# Patient Record
Sex: Female | Born: 1990 | Race: White | Hispanic: No | Marital: Single | Smoking: Never smoker
Health system: Southern US, Community
[De-identification: ages and names within clinical notes are randomized; demographics above are authoritative.]

## PROBLEM LIST (undated history)

## (undated) DIAGNOSIS — R253 Fasciculation: Secondary | ICD-10-CM

---

## 2018-02-11 ENCOUNTER — Emergency Department
Admission: EM | Admit: 2018-02-11 | Discharge: 2018-02-11 | Disposition: A | Discharge status: Home / Self Care | Payer: Self-pay | Attending: Emergency Medicine | Admitting: Emergency Medicine

## 2018-02-11 ENCOUNTER — Encounter: Payer: Self-pay | Admitting: Emergency Medicine

## 2018-02-11 ENCOUNTER — Other Ambulatory Visit: Payer: Self-pay

## 2018-02-11 ENCOUNTER — Emergency Department: Payer: Self-pay

## 2018-02-11 DIAGNOSIS — R109 Unspecified abdominal pain: Secondary | ICD-10-CM

## 2018-02-11 DIAGNOSIS — N390 Urinary tract infection, site not specified: Secondary | ICD-10-CM | POA: Insufficient documentation

## 2018-02-11 DIAGNOSIS — R112 Nausea with vomiting, unspecified: Secondary | ICD-10-CM | POA: Insufficient documentation

## 2018-02-11 HISTORY — DX: Fasciculation: R25.3

## 2018-02-11 LAB — URINALYSIS, COMPLETE (UACMP) WITH MICROSCOPIC
Bilirubin Urine: NEGATIVE
GLUCOSE, UA: NEGATIVE mg/dL
Ketones, ur: 20 mg/dL — AB
NITRITE: NEGATIVE
PH: 6 (ref 5.0–8.0)
PROTEIN: 30 mg/dL — AB
SPECIFIC GRAVITY, URINE: 1.031 — AB (ref 1.005–1.030)

## 2018-02-11 LAB — COMPREHENSIVE METABOLIC PANEL
ALT: 20 U/L (ref 0–44)
AST: 22 U/L (ref 15–41)
Albumin: 4.5 g/dL (ref 3.5–5.0)
Alkaline Phosphatase: 64 U/L (ref 38–126)
Anion gap: 11 (ref 5–15)
BILIRUBIN TOTAL: 0.9 mg/dL (ref 0.3–1.2)
BUN: 15 mg/dL (ref 6–20)
CHLORIDE: 107 mmol/L (ref 98–111)
CO2: 20 mmol/L — ABNORMAL LOW (ref 22–32)
Calcium: 9.5 mg/dL (ref 8.9–10.3)
Creatinine, Ser: 0.95 mg/dL (ref 0.44–1.00)
Glucose, Bld: 111 mg/dL — ABNORMAL HIGH (ref 70–99)
Potassium: 3.7 mmol/L (ref 3.5–5.1)
Sodium: 138 mmol/L (ref 135–145)
TOTAL PROTEIN: 8.2 g/dL — AB (ref 6.5–8.1)

## 2018-02-11 LAB — CBC
HCT: 44.7 % (ref 35.0–47.0)
HEMOGLOBIN: 15.6 g/dL (ref 12.0–16.0)
MCH: 30 pg (ref 26.0–34.0)
MCHC: 34.9 g/dL (ref 32.0–36.0)
MCV: 86 fL (ref 80.0–100.0)
PLATELETS: 454 10*3/uL — AB (ref 150–440)
RBC: 5.2 MIL/uL (ref 3.80–5.20)
RDW: 13.2 % (ref 11.5–14.5)
WBC: 11.6 10*3/uL — AB (ref 3.6–11.0)

## 2018-02-11 LAB — LIPASE, BLOOD: LIPASE: 24 U/L (ref 11–51)

## 2018-02-11 LAB — PREGNANCY, URINE: Preg Test, Ur: NEGATIVE

## 2018-02-11 MED ORDER — CEPHALEXIN 500 MG PO CAPS
500.0000 mg | ORAL_CAPSULE | Freq: Once | ORAL | Status: AC
  Administered 2018-02-11: 500 mg via ORAL
  Filled 2018-02-11: qty 1

## 2018-02-11 MED ORDER — IOPAMIDOL (ISOVUE-300) INJECTION 61%
125.0000 mL | Freq: Once | INTRAVENOUS | Status: AC | PRN
  Administered 2018-02-11: 125 mL via INTRAVENOUS

## 2018-02-11 MED ORDER — ONDANSETRON HCL 4 MG PO TABS: 4.0000 mg | ORAL_TABLET | Freq: Every day | ORAL | 0 refills | Status: AC | PRN

## 2018-02-11 MED ORDER — CEPHALEXIN 500 MG PO CAPS: 500.0000 mg | ORAL_CAPSULE | Freq: Three times a day (TID) | ORAL | 0 refills | Status: AC

## 2018-02-11 MED ORDER — SODIUM CHLORIDE 0.9 % IV BOLUS
1000.0000 mL | Freq: Once | INTRAVENOUS | Status: AC
  Administered 2018-02-11: 1000 mL via INTRAVENOUS

## 2018-02-11 MED ORDER — ONDANSETRON HCL 4 MG/2ML IJ SOLN
4.0000 mg | Freq: Once | INTRAMUSCULAR | Status: AC
  Administered 2018-02-11: 4 mg via INTRAVENOUS
  Filled 2018-02-11: qty 2

## 2018-02-11 NOTE — ED Provider Notes (Signed)
[facility] Emergency Department Provider Note ____________________________________________   First MD Initiated Contact with Patient 02/11/18 1421     (approximate)  I have reviewed the triage vital signs and the nursing notes.   HISTORY  Chief Complaint Abdominal Pain   HPI Laura Sullivan is a 27 y.o. female without any chronic medical conditions was presented to the emergency department today complaining of right-sided abdominal pain over the past 3 days.  She says that she is also vomited 3 times.  Says that the pain is a 5-6 out of 10 and is an aching pain that is nonradiating.  Denies any burning with urination.  Denies any vaginal discharge but says that she is on her period.  Denies any history of abdominal surgery.   Past Medical History:  Diagnosis Date  . Twitching     There are no active problems to display for this patient.   History reviewed. No pertinent surgical history.  Prior to Admission medications   Not on File    Allergies Patient has no known allergies.  History reviewed. No pertinent family history.  Social History Social History   Tobacco Use  . Smoking status: Never Smoker  . Smokeless tobacco: Never Used  Substance Use Topics  . Alcohol use: Never    Frequency: Never  . Drug use: Never    Review of Systems  Constitutional: No fever/chills Eyes: No visual changes. ENT: No sore throat. Cardiovascular: Denies chest pain. Respiratory: Denies shortness of breath. Gastrointestinal:     No constipation. Genitourinary: Negative for dysuria. Musculoskeletal: Negative for back pain. Skin: Negative for rash. Neurological: Negative for headaches, focal weakness or numbness.   ____________________________________________   PHYSICAL EXAM:  VITAL SIGNS: ED Triage Vitals [02/11/18 1055]  Enc Vitals Group     BP (!) 144/115     Pulse Rate (!) 115     Resp (!) 24     Temp 98.6 F (37 C)     Temp  Source Oral     SpO2 97 %     Weight 245 lb (111.1 kg)     Height 5\' 6"  (1.676 m)     Head Circumference      Peak Flow      Pain Score 5     Pain Loc      Pain Edu?      Excl. in GC?     Constitutional: Alert and oriented. Well appearing and in no acute distress. Eyes: Conjunctivae are normal.  Head: Atraumatic. Nose: No congestion/rhinnorhea. Mouth/Throat: Mucous membranes are moist.  Neck: No stridor.   Cardiovascular: Normal rate, regular rhythm. Grossly normal heart sounds.  Heart rate of 83 bpm in the room. Respiratory: Normal respiratory effort.  No retractions. Lungs CTAB. Gastrointestinal: Soft with moderate right upper as well as right periumbilical tenderness to palpation.  No right lower quadrant tenderness to palpation.  No left-sided abdominal tenderness to palpation.  No distention.  No rebound or guarding. Musculoskeletal: No lower extremity tenderness nor edema.  No joint effusions. Neurologic:  Normal speech and language. No gross focal neurologic deficits are appreciated. Skin:  Skin is warm, dry and intact. No rash noted. Psychiatric: Mood and affect are normal. Speech and behavior are normal.  ____________________________________________   LABS (all labs ordered are listed, but only abnormal results are displayed)  Labs Reviewed  COMPREHENSIVE METABOLIC PANEL - Abnormal; Notable for the following components:      Result Value   CO2 20 (*)  Glucose, Bld 111 (*)    Total Protein 8.2 (*)    All other components within normal limits  CBC - Abnormal; Notable for the following components:   WBC 11.6 (*)    Platelets 454 (*)    All other components within normal limits  URINALYSIS, COMPLETE (UACMP) WITH MICROSCOPIC - Abnormal; Notable for the following components:   Color, Urine AMBER (*)    APPearance CLOUDY (*)    Specific Gravity, Urine 1.031 (*)    Hgb urine dipstick LARGE (*)    Ketones, ur 20 (*)    Protein, ur 30 (*)    Leukocytes, UA LARGE (*)     WBC, UA >50 (*)    Bacteria, UA RARE (*)    All other components within normal limits  LIPASE, BLOOD  PREGNANCY, URINE  POC URINE PREG, ED   ____________________________________________  EKG  ED ECG REPORT I, Arelia Longest, the attending physician, personally viewed and interpreted this ECG.   Date: 02/11/2018  EKG Time: 1102  Rate: 103  Rhythm: sinus tachycardia  Axis: Normal  Intervals:none  ST&T Change: No ST segment elevation or depression.  No abnormal T wave inversion.  ____________________________________________  RADIOLOGY  No acute finding on the CT of the abdomen and pelvis. ____________________________________________   PROCEDURES  Procedure(s) performed:   Procedures  Critical Care performed:   ____________________________________________   INITIAL IMPRESSION / ASSESSMENT AND PLAN / ED COURSE  Pertinent labs & imaging results that were available during my care of the patient were reviewed by me and considered in my medical decision making (see chart for details).  Differential diagnosis includes, but is not limited to, ovarian cyst, ovarian torsion, acute appendicitis, diverticulitis, urinary tract infection/pyelonephritis, endometriosis, bowel obstruction, colitis, renal colic, gastroenteritis, hernia, fibroids, endometriosis, pregnancy related pain including ectopic pregnancy, etc. Differential diagnosis includes, but is not limited to, biliary disease (biliary colic, acute cholecystitis, cholangitis, choledocholithiasis, etc), intrathoracic causes for epigastric abdominal pain including ACS, gastritis, duodenitis, pancreatitis, small bowel or large bowel obstruction, abdominal aortic aneurysm, hernia, and ulcer(s). As part of my medical decision making, I reviewed the following data within the electronic MEDICAL RECORD NUMBERNo previous ER records on file for review.   ----------------------------------------- 6:25 PM on  02/11/2018 -----------------------------------------  Patient at this time resting without any distress.  Likely UTI.  Will treat with antibiotics.  We will also give Zofran.  Patient understanding of the diagnosis as well as treatment and willing to comply.  Will be discharged at this time. ____________________________________________   FINAL CLINICAL IMPRESSION(S) / ED DIAGNOSES  UTI.  Abdominal pain.  Nausea and vomiting.  NEW MEDICATIONS STARTED DURING THIS VISIT:  New Prescriptions   No medications on file     Note:  This document was prepared using Dragon voice recognition software and may include unintentional dictation errors.     Myrna Blazer, MD 02/11/18 [phone]

## 2018-02-11 NOTE — ED Triage Notes (Signed)
Pt here for upper abdominal pain, NVD.  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  Only medical dx per pt is twitching. No fevers.  Also feels weak and like going to pass out.

## 2018-02-11 NOTE — ED Triage Notes (Signed)
First Nurse Note:  C/O abdominal pain, n/V x 2-3 days.

## 2018-02-11 NOTE — ED Notes (Signed)
Pt po challenged with water per verbal order from MD.

## 2018-02-13 LAB — URINE CULTURE

## 2020-01-13 IMAGING — CT CT ABD-PELV W/ CM
2 of 4 series · 16 of 46 positions shown, 18 images · IV contrast (APPLIED)
Comparison: None.

CLINICAL DATA: Nausea, vomiting and abdominal/pelvic pain for 2-3
days.

EXAM:
CT ABDOMEN AND PELVIS WITH CONTRAST
TECHNIQUE: Multidetector CT imaging of the abdomen and pelvis was performed
using the standard protocol following bolus administration of
intravenous contrast.
CONTRAST:  125mL N9GJHD-B44 IOPAMIDOL (N9GJHD-B44) INJECTION 61%

[Series 2: routine abd/pel with · axial · 0.92mm/px · z∈[-506,-66]mm · 13 of 98 slices shown, 15 images]
[im 5/98  soft-tissue]
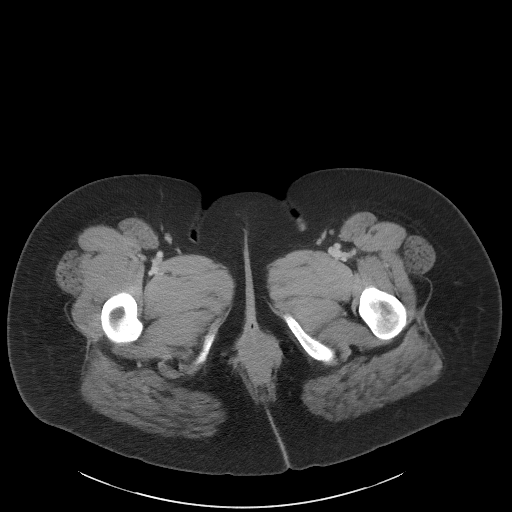
[im 5/98  bone]
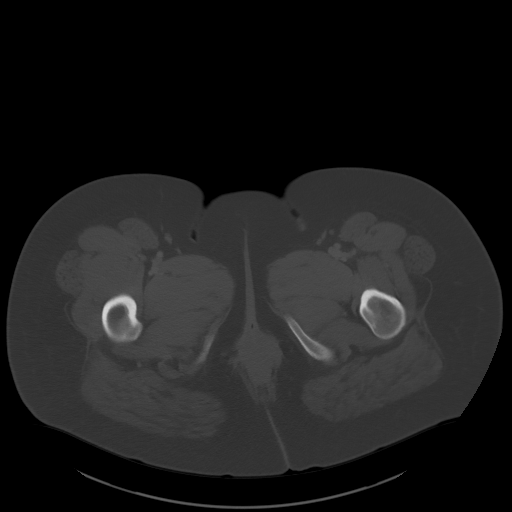
[im 13/98  soft-tissue]
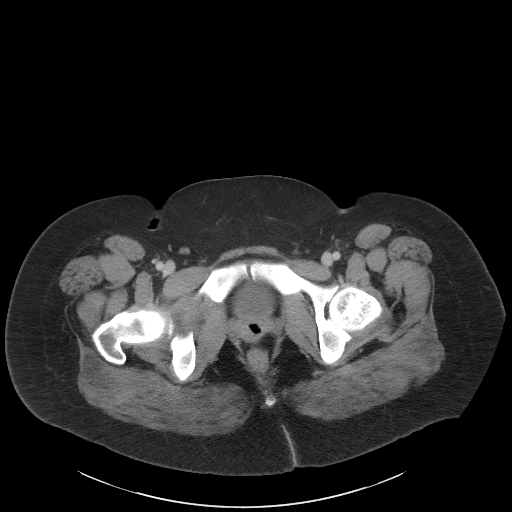
[im 22/98  soft-tissue]
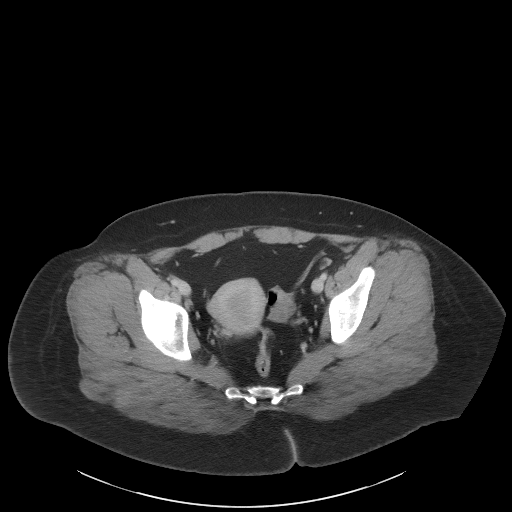
[im 26/98  soft-tissue]
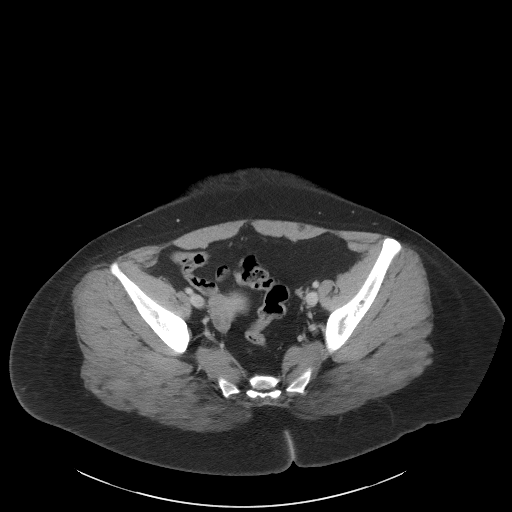
[im 34/98  soft-tissue]
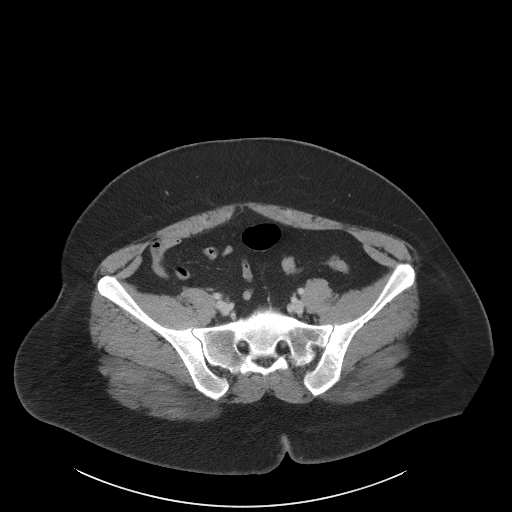
[im 43/98  soft-tissue]
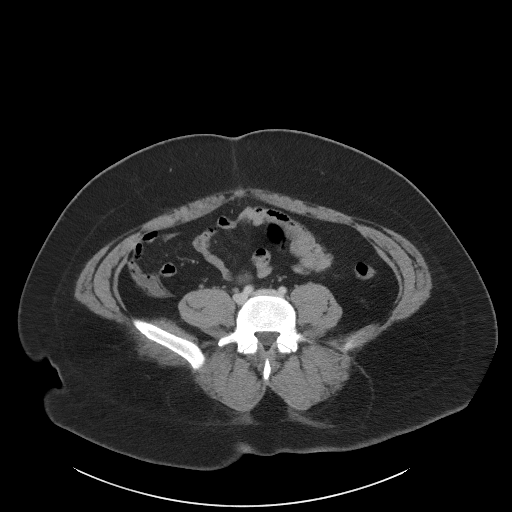
[im 51/98  soft-tissue]
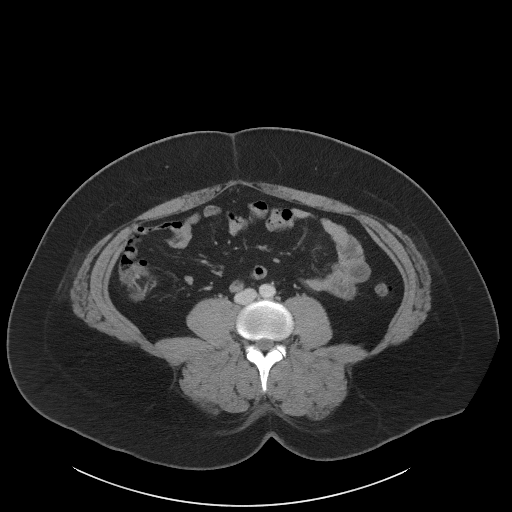
[im 55/98  soft-tissue]
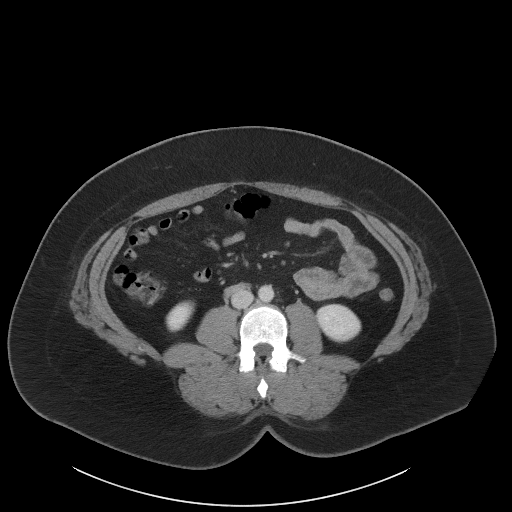
[im 64/98  soft-tissue]
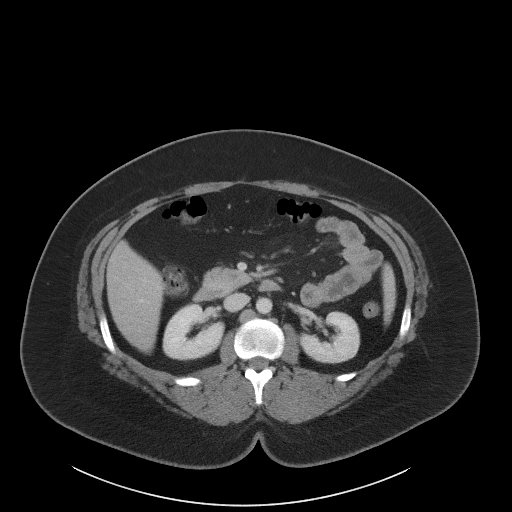
[im 64/98  bone]
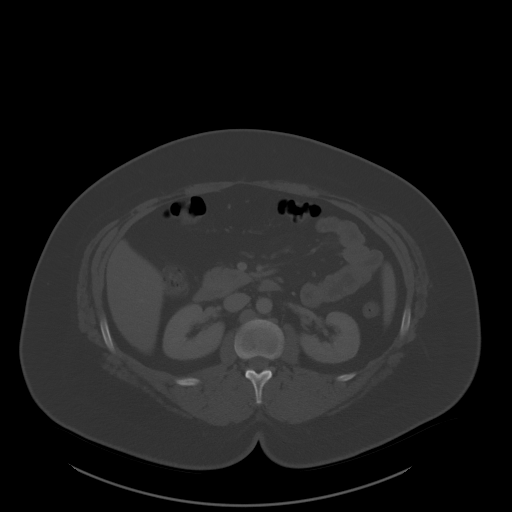
[im 72/98  soft-tissue]
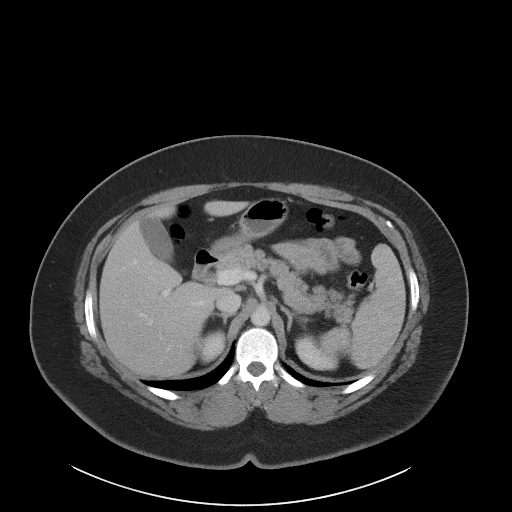
[im 76/98  soft-tissue]
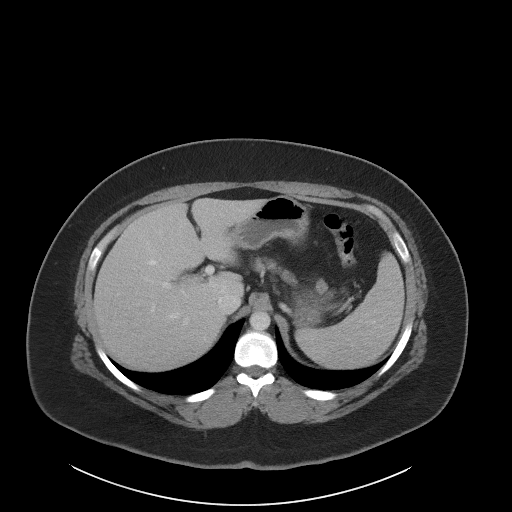
[im 85/98  soft-tissue]
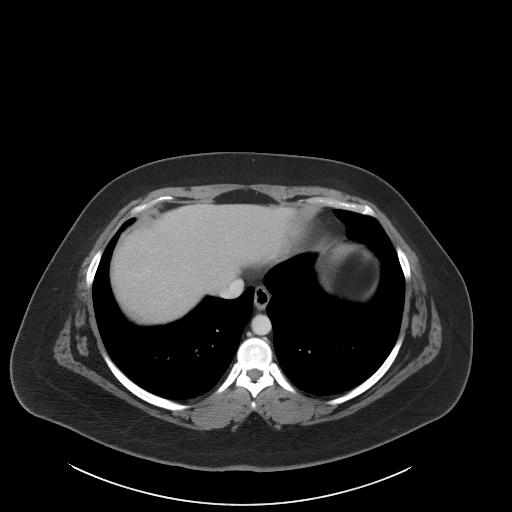
[im 93/98  soft-tissue]
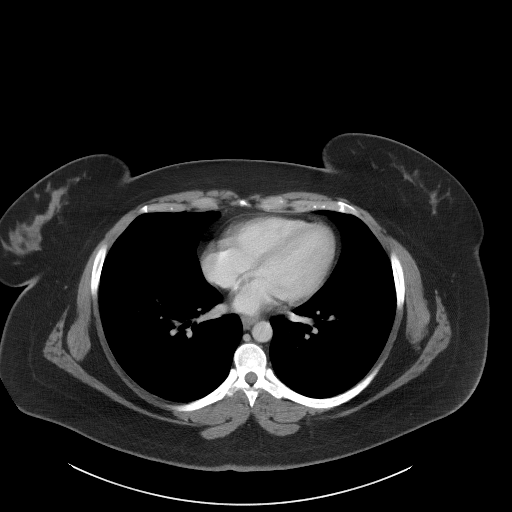

[Series 5: coronal st · coronal · 0.82mm/px · 3 of 102 slices shown]
[im 34/102  soft-tissue]
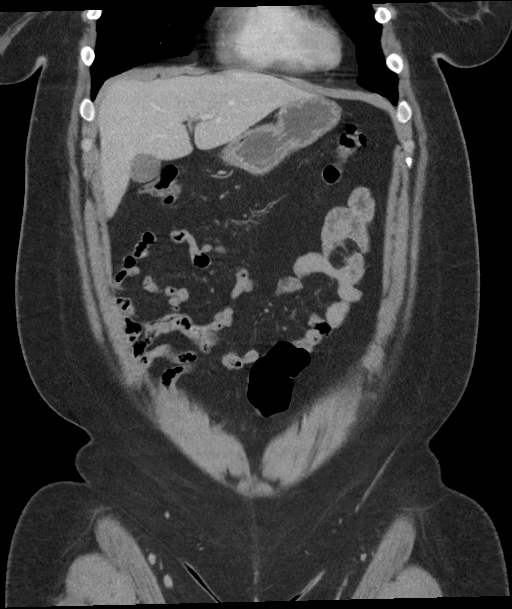
[im 45/102  soft-tissue]
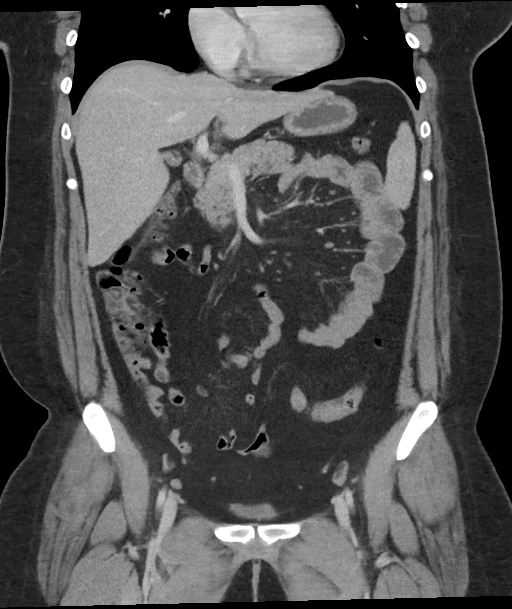
[im 57/102  soft-tissue]
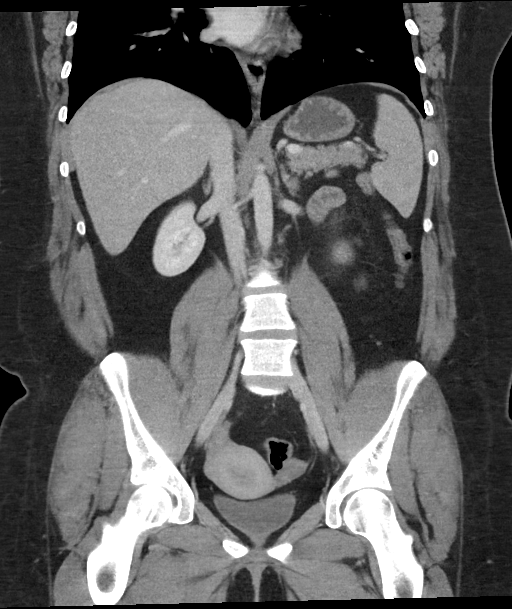

[16 of 46 positions shown; findings below may reference images not displayed]

FINDINGS: Lower chest: No acute abnormality.

Hepatobiliary: No focal liver abnormality is seen. No gallstones,
gallbladder wall thickening, or biliary dilatation.

Pancreas: Unremarkable. No pancreatic ductal dilatation or
surrounding inflammatory changes.

Spleen: Normal in size without focal abnormality.

Adrenals/Urinary Tract: Adrenal glands appear normal. Kidneys appear
normal without mass, stone, hydronephrosis or perinephric
inflammation. No ureteral or bladder calculi identified. Bladder
appears normal, partially decompressed.

Stomach/Bowel: No dilated large or small bowel loops. No evidence of
bowel wall inflammation seen. Appendix is normal. Stomach is
unremarkable, partially decompressed.

Vascular/Lymphatic: No significant vascular findings are present. No
enlarged abdominal or pelvic lymph nodes.

Reproductive: Uterus and bilateral adnexa are unremarkable.

Other: No free fluid or abscess collection. No free intraperitoneal
air.

Musculoskeletal: No acute or significant osseous findings.
IMPRESSION: Normal CT of the abdomen and pelvis. No bowel obstruction or
evidence of bowel wall inflammation.

## 2020-11-01 ENCOUNTER — Emergency Department
Admission: EM | Admit: 2020-11-01 | Discharge: 2020-11-01 | Disposition: A | Discharge status: Home / Self Care | Payer: Self-pay | Attending: Emergency Medicine | Admitting: Emergency Medicine

## 2020-11-01 ENCOUNTER — Emergency Department: Payer: Self-pay

## 2020-11-01 ENCOUNTER — Encounter: Payer: Self-pay | Admitting: Intensive Care

## 2020-11-01 ENCOUNTER — Other Ambulatory Visit: Payer: Self-pay

## 2020-11-01 DIAGNOSIS — J029 Acute pharyngitis, unspecified: Secondary | ICD-10-CM | POA: Insufficient documentation

## 2020-11-01 DIAGNOSIS — R1013 Epigastric pain: Secondary | ICD-10-CM

## 2020-11-01 DIAGNOSIS — D72829 Elevated white blood cell count, unspecified: Secondary | ICD-10-CM | POA: Insufficient documentation

## 2020-11-01 DIAGNOSIS — N3000 Acute cystitis without hematuria: Secondary | ICD-10-CM | POA: Insufficient documentation

## 2020-11-01 LAB — COMPREHENSIVE METABOLIC PANEL
ALT: 15 U/L (ref 0–44)
AST: 18 U/L (ref 15–41)
Albumin: 4.2 g/dL (ref 3.5–5.0)
Alkaline Phosphatase: 58 U/L (ref 38–126)
Anion gap: 10 (ref 5–15)
BUN: 12 mg/dL (ref 6–20)
CO2: 21 mmol/L — ABNORMAL LOW (ref 22–32)
Calcium: 8.9 mg/dL (ref 8.9–10.3)
Chloride: 106 mmol/L (ref 98–111)
Creatinine, Ser: 0.88 mg/dL (ref 0.44–1.00)
GFR, Estimated: >60 mL/min (ref >60)
Glucose, Bld: 103 mg/dL — ABNORMAL HIGH (ref 70–99)
Potassium: 3.7 mmol/L (ref 3.5–5.1)
Sodium: 137 mmol/L (ref 135–145)
Total Bilirubin: 0.8 mg/dL (ref 0.3–1.2)
Total Protein: 7.8 g/dL (ref 6.5–8.1)

## 2020-11-01 LAB — CBC
HCT: 41.5 % (ref 36.0–46.0)
Hemoglobin: 14.4 g/dL (ref 12.0–15.0)
MCH: 29.4 pg (ref 26.0–34.0)
MCHC: 34.7 g/dL (ref 30.0–36.0)
MCV: 84.9 fL (ref 80.0–100.0)
Platelets: 372 10*3/uL (ref 150–400)
RBC: 4.89 MIL/uL (ref 3.87–5.11)
RDW: 12.5 % (ref 11.5–15.5)
WBC: 8.5 10*3/uL (ref 4.0–10.5)
nRBC: 0 % (ref 0.0–0.2)

## 2020-11-01 LAB — GROUP A STREP BY PCR: Group A Strep by PCR: NOT DETECTED

## 2020-11-01 LAB — URINALYSIS, COMPLETE (UACMP) WITH MICROSCOPIC
Bilirubin Urine: NEGATIVE
Glucose, UA: NEGATIVE mg/dL
Hgb urine dipstick: NEGATIVE
Ketones, ur: NEGATIVE mg/dL
Nitrite: NEGATIVE
Protein, ur: 30 mg/dL — AB
Specific Gravity, Urine: 1.033 — ABNORMAL HIGH (ref 1.005–1.030)
pH: 6 (ref 5.0–8.0)

## 2020-11-01 LAB — LIPASE, BLOOD: Lipase: 29 U/L (ref 11–51)

## 2020-11-01 LAB — POC URINE PREG, ED: Preg Test, Ur: NEGATIVE

## 2020-11-01 MED ORDER — CEPHALEXIN 500 MG PO CAPS: 500.0000 mg | ORAL_CAPSULE | Freq: Two times a day (BID) | ORAL | 0 refills | Status: AC

## 2020-11-01 NOTE — ED Notes (Signed)
Patient declined discharge vital signs. Patient left with father.

## 2020-11-01 NOTE — ED Triage Notes (Signed)
Patient c/o sore throat that hurts to swallow and abdominal pain that started around Tuesday/Wednesday

## 2020-11-01 NOTE — ED Provider Notes (Signed)
[facility] Emergency Department Provider Note ____________________________________________   Event Date/Time   First MD Initiated Contact with Patient 11/01/20 1218     (approximate)  I have reviewed the triage vital signs and the nursing notes.   HISTORY  Chief Complaint Sore Throat  HPI Laura Sullivan is a 30 y.o. female with history of "twitching" presents to the emergency department for treatment and evaluation of epigastric pain that started 3 days ago.  She is also having some nausea without vomiting.  No previous symptoms like this in the past.  No alleviating measures attempted prior to arrival.  No known fever.  No diarrhea.  No exposure to COVID-19.          Past Medical History:  Diagnosis Date  . Twitching     There are no problems to display for this patient.   History reviewed. No pertinent surgical history.  Prior to Admission medications   Medication Sig Start Date End Date Taking? Authorizing Provider  cephALEXin (KEFLEX) 500 MG capsule Take 1 capsule (500 mg total) by mouth 2 (two) times daily for 7 days. 11/01/20 11/08/20 Yes Naturi Alarid B, FNP  ondansetron (ZOFRAN) 4 MG tablet Take 1 tablet (4 mg total) by mouth daily as needed. 02/11/18   Schaevitz, Myra Rude, MD    Allergies Patient has no known allergies.  History reviewed. No pertinent family history.  Social History Social History   Tobacco Use  . Smoking status: Never Smoker  . Smokeless tobacco: Never Used  Substance Use Topics  . Alcohol use: Never  . Drug use: Never    Review of Systems  Constitutional: No fever/chills Eyes: No visual changes. ENT: No sore throat. Cardiovascular: Denies chest pain. Respiratory: Denies shortness of breath. Gastrointestinal: No abdominal pain.  No nausea, no vomiting.  No diarrhea.  No constipation. Genitourinary: Negative for dysuria. Musculoskeletal: Negative for back pain. Skin: Negative for  rash. Neurological: Negative for headaches, focal weakness or numbness. Psychological: Stuttering speech and pressured speech pattern ____________________________________________   PHYSICAL EXAM:  VITAL SIGNS: ED Triage Vitals  Enc Vitals Group     BP 11/01/20 1125 (!) 150/89     Pulse Rate 11/01/20 1125 (!) 105     Resp 11/01/20 1125 20     Temp 11/01/20 1125 97.8 F (36.6 C)     Temp Source 11/01/20 1125 Oral     SpO2 11/01/20 1125 99 %     Weight 11/01/20 1130 243 lb 3.2 oz (110.3 kg)     Height 11/01/20 1126 5\' 6"  (1.676 m)     Head Circumference --      Peak Flow --      Pain Score 11/01/20 1126 4     Pain Loc --      Pain Edu? --      Excl. in GC? --     Constitutional: Alert and oriented. Well appearing and in no acute distress. Eyes: Conjunctivae are normal. Head: Atraumatic. Nose: No congestion/rhinnorhea. Mouth/Throat: Mucous membranes are moist.  Oropharynx non-erythematous. Neck: No stridor.   Hematological/Lymphatic/Immunilogical: No cervical lymphadenopathy. Cardiovascular: Normal rate, regular rhythm. Grossly normal heart sounds.  Good peripheral circulation. Respiratory: Normal respiratory effort.  No retractions. Lungs CTAB. Gastrointestinal: Soft and diffuse tenderness in the epigastric and right/left upper quadrants.  No rebound tenderness or guarding no distention. No abdominal bruits.  Genitourinary:  Musculoskeletal: No lower extremity tenderness nor edema.  No joint effusions. Neurologic:  Normal speech and language. No gross focal neurologic  deficits are appreciated. No gait instability. Skin:  Skin is warm, dry and intact. No rash noted. Psychiatric: Pressured speech; stuttering speech. Anxious.  ____________________________________________   LABS (all labs ordered are listed, but only abnormal results are displayed)  Labs Reviewed  COMPREHENSIVE METABOLIC PANEL - Abnormal; Notable for the following components:      Result Value   CO2 21  (*)    Glucose, Bld 103 (*)    All other components within normal limits  URINALYSIS, COMPLETE (UACMP) WITH MICROSCOPIC - Abnormal; Notable for the following components:   Color, Urine AMBER (*)    APPearance CLOUDY (*)    Specific Gravity, Urine 1.033 (*)    Protein, ur 30 (*)    Leukocytes,Ua MODERATE (*)    Bacteria, UA RARE (*)    All other components within normal limits  GROUP A STREP BY PCR  LIPASE, BLOOD  CBC  POC URINE PREG, ED   ____________________________________________  EKG  Not indicated ____________________________________________  RADIOLOGY  ED MD interpretation:    [place] RUQ shows no acute concerns.   I, Kem Boroughs, personally viewed and evaluated these images (plain radiographs) as part of my medical decision making, as well as reviewing the written report by the radiologist.  Official radiology report(s): US Abdomen Limited RUQ (LIVER/GB)  Result Date: 11/01/2020 CLINICAL DATA:  Acute epigastric pain x 3-4 days. EXAM: ULTRASOUND ABDOMEN LIMITED RIGHT UPPER QUADRANT COMPARISON:  None. FINDINGS: Gallbladder: No gallstones or wall thickening visualized (2.1 mm). No sonographic Murphy sign noted by sonographer. Common bile duct: Diameter: 3.2 mm Liver: No focal lesion identified. Diffusely increased echogenicity of the liver parenchyma is noted. Portal vein is patent on color Doppler imaging with normal direction of blood flow towards the liver. Other: None. IMPRESSION: Fatty liver. Electronically Signed   By: Aram Candela M.D.   On: 11/01/2020 14:40    ____________________________________________   PROCEDURES  Procedure(s) performed (including Critical Care):  Procedures  ____________________________________________   INITIAL IMPRESSION / ASSESSMENT AND PLAN     30 year old female presenting to the emergency department for treatment and evaluation of epigastric and right upper quadrant pain.  See HPI for further details.  Plan will be to get an  ultrasound of the right upper quadrant and review protocol labs drawn prior to ER room assignment.  Triage note mentions sore throat, however patient denies sore throat.  DIFFERENTIAL DIAGNOSIS  GERD, peptic ulcer, COVID-19, streptococcal pharyngitis, gastroenteritis  ED COURSE  Labs are overall reassuring.  There is no leukocytosis or electrolyte abnormality.  Strep screen is negative.  Lipase is negative.  Awaiting ultrasound results.  Patient declined COVID-19 test.  Urinalysis shows rare bacteria with moderate leukocytes, 21-50 white blood cells and cloudy in appearance.  Strep screen is negative.  Results reviewed with the patient.  She is to follow-up with her primary care provider or return to the emergency department for symptoms of concern.    ___________________________________________   FINAL CLINICAL IMPRESSION(S) / ED DIAGNOSES  Final diagnoses:  Acute epigastric pain  Acute cystitis without hematuria     ED Discharge Orders         Ordered    cephALEXin (KEFLEX) 500 MG capsule  2 times daily        11/01/20 1616           Renly Hansen was evaluated in Emergency Department on 11/01/2020 for the symptoms described in the history of present illness. She was evaluated in the context of the global COVID-19 pandemic,  which necessitated consideration that the patient might be at risk for infection with the SARS-CoV-2 virus that causes COVID-19. Institutional protocols and algorithms that pertain to the evaluation of patients at risk for COVID-19 are in a state of rapid change based on information released by regulatory bodies including the CDC and federal and state organizations. These policies and algorithms were followed during the patient's care in the ED.   Note:  This document was prepared using Dragon voice recognition software and may include unintentional dictation errors.   Chinita Pester, FNP 11/01/20 2031    Dionne Bucy, MD 11/02/20  [phone]

## 2021-04-04 ENCOUNTER — Emergency Department
Admission: EM | Admit: 2021-04-04 | Discharge: 2021-04-04 | Disposition: A | Discharge status: Home / Self Care | Payer: Self-pay | Attending: Emergency Medicine | Admitting: Emergency Medicine

## 2021-04-04 ENCOUNTER — Other Ambulatory Visit: Payer: Self-pay

## 2021-04-04 DIAGNOSIS — R110 Nausea: Secondary | ICD-10-CM | POA: Insufficient documentation

## 2021-04-04 DIAGNOSIS — E860 Dehydration: Secondary | ICD-10-CM | POA: Insufficient documentation

## 2021-04-04 DIAGNOSIS — R42 Dizziness and giddiness: Secondary | ICD-10-CM

## 2021-04-04 LAB — URINALYSIS, ROUTINE W REFLEX MICROSCOPIC
Bilirubin Urine: NEGATIVE
Glucose, UA: NEGATIVE mg/dL
Hgb urine dipstick: NEGATIVE
Ketones, ur: NEGATIVE mg/dL
Nitrite: NEGATIVE
Protein, ur: NEGATIVE mg/dL
Specific Gravity, Urine: 1.024 (ref 1.005–1.030)
pH: 6 (ref 5.0–8.0)

## 2021-04-04 LAB — BASIC METABOLIC PANEL
Anion gap: 8 (ref 5–15)
BUN: 11 mg/dL (ref 6–20)
CO2: 22 mmol/L (ref 22–32)
Calcium: 9.2 mg/dL (ref 8.9–10.3)
Chloride: 106 mmol/L (ref 98–111)
Creatinine, Ser: 0.93 mg/dL (ref 0.44–1.00)
GFR, Estimated: >60 mL/min (ref >60)
Glucose, Bld: 112 mg/dL — ABNORMAL HIGH (ref 70–99)
Potassium: 3.8 mmol/L (ref 3.5–5.1)
Sodium: 136 mmol/L (ref 135–145)

## 2021-04-04 LAB — CBC
HCT: 42 % (ref 36.0–46.0)
Hemoglobin: 14.4 g/dL (ref 12.0–15.0)
MCH: 29.9 pg (ref 26.0–34.0)
MCHC: 34.3 g/dL (ref 30.0–36.0)
MCV: 87.3 fL (ref 80.0–100.0)
Platelets: 372 10*3/uL (ref 150–400)
RBC: 4.81 MIL/uL (ref 3.87–5.11)
RDW: 12.2 % (ref 11.5–15.5)
WBC: 8.9 10*3/uL (ref 4.0–10.5)
nRBC: 0 % (ref 0.0–0.2)

## 2021-04-04 LAB — PREGNANCY, URINE: Preg Test, Ur: NEGATIVE

## 2021-04-04 MED ORDER — MECLIZINE HCL 25 MG PO TABS: 25.0000 mg | ORAL_TABLET | Freq: Three times a day (TID) | ORAL | 0 refills | Status: AC | PRN

## 2021-04-04 NOTE — ED Notes (Signed)
Patient ambulated to the room with steady gait.

## 2021-04-04 NOTE — ED Triage Notes (Signed)
Pt states reports intermittent dizziness over the last week. Pt states when she goes to stand up at times she feels room is spinning when changing positions, but other times she just feels "dizzy" without the sensation of spinning. NAD noted at this time.

## 2021-04-04 NOTE — ED Notes (Signed)
Patient declined discharge vital signs. 

## 2021-04-04 NOTE — ED Provider Notes (Signed)
[facility] Emergency Department Provider Note   ____________________________________________   Event Date/Time   First MD Initiated Contact with Patient 04/04/21 1258     (approximate)  I have reviewed the triage vital signs and the nursing notes.  HISTORY  Chief Complaint Dizziness  HPI Laura Sullivan is a 30 y.o. female who presents for intermittent dizziness  LOCATION: Generalized DURATION: 3 days prior to arrival TIMING: Intermittent SEVERITY: Severe QUALITY: Vertigo/lightheadedness CONTEXT: Patient states that over the last 3 days she has had intermittent episodes of vertigo and lightheadedness that are worse when she is getting up from laying down or standing from a seated position.  Patient states that some of these episodes occur with "the room spinning around me" and some of them just occur with lightheadedness. MODIFYING FACTORS: Moving her head a specific way or getting up from a seated position worsens these symptoms and she denies any relieving factors ASSOCIATED SYMPTOMS: Mild nausea   Per medical record review, patient has history of twitching          Past Medical History:  Diagnosis Date   Twitching     There are no problems to display for this patient.   History reviewed. No pertinent surgical history.  Prior to Admission medications   Medication Sig Start Date End Date Taking? Authorizing Provider  meclizine (ANTIVERT) 25 MG tablet Take 1 tablet (25 mg total) by mouth 3 (three) times daily as needed for dizziness. 04/04/21  Yes Merwyn Katos, MD  ondansetron (ZOFRAN) 4 MG tablet Take 1 tablet (4 mg total) by mouth daily as needed. 02/11/18   Schaevitz, Myra Rude, MD    Allergies Patient has no known allergies.  History reviewed. No pertinent family history.  Social History Social History   Tobacco Use   Smoking status: Never   Smokeless tobacco: Never  Substance Use Topics   Alcohol use: Never    Drug use: Never    Review of Systems Constitutional: No fever/chills Eyes: No visual changes. ENT: No sore throat. Cardiovascular: Denies chest pain. Respiratory: Denies shortness of breath. Gastrointestinal: No abdominal pain.  No nausea, no vomiting.  No diarrhea. Genitourinary: Negative for dysuria. Musculoskeletal: Negative for acute arthralgias Skin: Negative for rash. Neurological: Positive for vertigo/headedness.  Negative for headaches, weakness/numbness/paresthesias in any extremity Psychiatric: Negative for suicidal ideation/homicidal ideation   ____________________________________________   PHYSICAL EXAM:  VITAL SIGNS: ED Triage Vitals  Enc Vitals Group     BP 04/04/21 1051 (!) 134/107     Pulse Rate 04/04/21 1051 (!) 121     Resp 04/04/21 1051 20     Temp 04/04/21 1051 98.4 F (36.9 C)     Temp Source 04/04/21 1051 Oral     SpO2 04/04/21 1051 95 %     Weight 04/04/21 1051 220 lb (99.8 kg)     Height 04/04/21 1051 5\' 6"  (1.676 m)     Head Circumference --      Peak Flow --      Pain Score 04/04/21 1057 0     Pain Loc --      Pain Edu? --      Excl. in GC? --    Constitutional: Alert and oriented. Well appearing and in no acute distress. Eyes: Conjunctivae are normal. PERRL. Head: Atraumatic. Nose: No congestion/rhinnorhea. Mouth/Throat: Mucous membranes are moist. Neck: No stridor Cardiovascular: Grossly normal heart sounds.  Good peripheral circulation. Respiratory: Normal respiratory effort.  No retractions. Gastrointestinal: Soft and nontender. No distention.  Musculoskeletal: No obvious deformities Neurologic:  Normal speech and language. No gross focal neurologic deficits are appreciated.  Positive hints exam for peripheral vertigo Skin:  Skin is warm and dry. No rash noted. Psychiatric: Mood and affect are normal. Speech and behavior are normal.  ____________________________________________   LABS (all labs ordered are listed, but only  abnormal results are displayed)  Labs Reviewed  BASIC METABOLIC PANEL - Abnormal; Notable for the following components:      Result Value   Glucose, Bld 112 (*)    All other components within normal limits  URINALYSIS, ROUTINE W REFLEX MICROSCOPIC - Abnormal; Notable for the following components:   Color, Urine YELLOW (*)    APPearance CLOUDY (*)    Leukocytes,Ua LARGE (*)    Bacteria, UA RARE (*)    All other components within normal limits  CBC  PREGNANCY, URINE  CBG MONITORING, ED   ____________________________________________  EKG  ED ECG REPORT I, Merwyn Katos, the attending physician, personally viewed and interpreted this ECG.  Date: 04/04/2021 EKG Time: 1057 Rate: 105 Rhythm: Tachycardic sinus rhythm QRS Axis: normal Intervals: normal ST/T Wave abnormalities: normal Narrative Interpretation: Tachycardic sinus rhythm.  No evidence of acute ischemia  PROCEDURES  Procedure(s) performed (including Critical Care):  Procedures   ____________________________________________   INITIAL IMPRESSION / ASSESSMENT AND PLAN / ED COURSE  As part of my medical decision making, I reviewed the following data within the electronic medical record, if available:  Nursing notes reviewed and incorporated, Labs reviewed, EKG interpreted, Old chart reviewed, Radiograph reviewed and Notes from prior ED visits reviewed and incorporated     Based on History, Exam, and Findings, presentation not consistent with syncope, seizure, stroke, meningitis, symptomatic anemia (gastrointestinal bleed), Increased ICP (cerebral tumor/mass), ICH. Additionally, I have a low suspicion for AOM, labyrinthitis, or other infectious process. Tx: meclizine Reassessment: Prior to discharge symptoms controlled, patient well appearing. Disposition:  Discharge. Strict return precautions discussed w/ full understanding. Advise follow up with primary care provider within 24-48 hours.       ____________________________________________   FINAL CLINICAL IMPRESSION(S) / ED DIAGNOSES  Final diagnoses:  Vertigo  Lightheadedness  Dehydration     ED Discharge Orders          Ordered    meclizine (ANTIVERT) 25 MG tablet  3 times daily PRN        04/04/21 1413             Note:  This document was prepared using Dragon voice recognition software and may include unintentional dictation errors.    Merwyn Katos, MD 04/04/21 1534

## 2022-01-17 ENCOUNTER — Emergency Department
Admission: EM | Admit: 2022-01-17 | Discharge: 2022-01-17 | Disposition: A | Discharge status: Home / Self Care | Payer: Self-pay | Attending: Emergency Medicine | Admitting: Emergency Medicine

## 2022-01-17 ENCOUNTER — Emergency Department: Payer: Self-pay

## 2022-01-17 ENCOUNTER — Other Ambulatory Visit: Payer: Self-pay

## 2022-01-17 ENCOUNTER — Encounter: Payer: Self-pay | Admitting: Emergency Medicine

## 2022-01-17 DIAGNOSIS — N760 Acute vaginitis: Secondary | ICD-10-CM | POA: Insufficient documentation

## 2022-01-17 DIAGNOSIS — B9689 Other specified bacterial agents as the cause of diseases classified elsewhere: Secondary | ICD-10-CM | POA: Insufficient documentation

## 2022-01-17 DIAGNOSIS — N946 Dysmenorrhea, unspecified: Secondary | ICD-10-CM | POA: Insufficient documentation

## 2022-01-17 LAB — COMPREHENSIVE METABOLIC PANEL
ALT: 14 U/L (ref 0–44)
AST: 21 U/L (ref 15–41)
Albumin: 4.2 g/dL (ref 3.5–5.0)
Alkaline Phosphatase: 55 U/L (ref 38–126)
Anion gap: 7 (ref 5–15)
BUN: 12 mg/dL (ref 6–20)
CO2: 21 mmol/L — ABNORMAL LOW (ref 22–32)
Calcium: 9 mg/dL (ref 8.9–10.3)
Chloride: 111 mmol/L (ref 98–111)
Creatinine, Ser: 0.82 mg/dL (ref 0.44–1.00)
GFR, Estimated: >60 mL/min (ref >60)
Glucose, Bld: 126 mg/dL — ABNORMAL HIGH (ref 70–99)
Potassium: 3.8 mmol/L (ref 3.5–5.1)
Sodium: 139 mmol/L (ref 135–145)
Total Bilirubin: 0.6 mg/dL (ref 0.3–1.2)
Total Protein: 7.6 g/dL (ref 6.5–8.1)

## 2022-01-17 LAB — URINALYSIS, ROUTINE W REFLEX MICROSCOPIC: Specific Gravity, Urine: 1.03 (ref 1.005–1.030)

## 2022-01-17 LAB — CBC
HCT: 42.3 % (ref 36.0–46.0)
Hemoglobin: 14 g/dL (ref 12.0–15.0)
MCH: 28.7 pg (ref 26.0–34.0)
MCHC: 33.1 g/dL (ref 30.0–36.0)
MCV: 86.7 fL (ref 80.0–100.0)
Platelets: 384 10*3/uL (ref 150–400)
RBC: 4.88 MIL/uL (ref 3.87–5.11)
RDW: 12.3 % (ref 11.5–15.5)
WBC: 8.2 10*3/uL (ref 4.0–10.5)
nRBC: 0 % (ref 0.0–0.2)

## 2022-01-17 LAB — URINALYSIS, MICROSCOPIC (REFLEX)
Bacteria, UA: NONE SEEN
RBC / HPF: >50 RBC/hpf (ref 0–5)

## 2022-01-17 LAB — CBC WITH DIFFERENTIAL/PLATELET
Abs Immature Granulocytes: 0.04 K/uL (ref 0.00–0.07)
Basophils Absolute: 0 K/uL (ref 0.0–0.1)
Basophils Relative: 1 %
Eosinophils Absolute: 0.1 K/uL (ref 0.0–0.5)
Eosinophils Relative: 1 %
HCT: 42.7 % (ref 36.0–46.0)
Hemoglobin: 13.9 g/dL (ref 12.0–15.0)
Immature Granulocytes: 1 %
Lymphocytes Relative: 24 %
Lymphs Abs: 1.9 K/uL (ref 0.7–4.0)
MCH: 28.3 pg (ref 26.0–34.0)
MCHC: 32.6 g/dL (ref 30.0–36.0)
MCV: 87 fL (ref 80.0–100.0)
Monocytes Absolute: 0.4 K/uL (ref 0.1–1.0)
Monocytes Relative: 5 %
Neutro Abs: 5.6 K/uL (ref 1.7–7.7)
Neutrophils Relative %: 68 %
Platelets: 395 K/uL (ref 150–400)
RBC: 4.91 MIL/uL (ref 3.87–5.11)
RDW: 12.6 % (ref 11.5–15.5)
WBC: 8.1 K/uL (ref 4.0–10.5)
nRBC: 0 % (ref 0.0–0.2)

## 2022-01-17 LAB — WET PREP, GENITAL
Sperm: NONE SEEN
Trich, Wet Prep: NONE SEEN
WBC, Wet Prep HPF POC: <10 (ref <10)
Yeast Wet Prep HPF POC: NONE SEEN

## 2022-01-17 LAB — LIPASE, BLOOD: Lipase: 28 U/L (ref 11–51)

## 2022-01-17 LAB — POC URINE PREG, ED: Preg Test, Ur: NEGATIVE

## 2022-01-17 MED ORDER — METRONIDAZOLE 500 MG PO TABS: 500.0000 mg | ORAL_TABLET | Freq: Two times a day (BID) | ORAL | 0 refills | Status: AC

## 2022-01-17 MED ORDER — IOHEXOL 300 MG/ML  SOLN
100.0000 mL | Freq: Once | INTRAMUSCULAR | Status: AC | PRN
  Administered 2022-01-17: 100 mL via INTRAVENOUS

## 2022-01-17 NOTE — Discharge Instructions (Addendum)
It may help if you take some Motrin when you start having bad cramps with the next..  You can take 4 of the over-the-counter pills 3 times a day for couple days.  This usually helps a good bit.  You can also follow-up with Dr. Dalbert Garnet who is on-call for OB/GYN.  She is quite good.  You do have some bacterial vaginosis.  This is an overgrowth of the normal bacteria that live in the vagina.  That may cause the itching.  I will give you some Flagyl antibiotic to take 1 pill twice a day.  Be careful not to drink any alcohol with this as it can make you very sick.  If the rectal pain continues to bother you with the.  You can also follow-up with Dr. Timothy Lasso.  He is the gastroenterologist.  He may be able to look up inside with a scope and see if there is anything going on that way.  Since you only have this with your menses and only every few months this may not be as urgent as following up with OB/GYN to make sure you are doing well with the Motrin etc.  OB/GYN will also be able to check on your swabs that we have done today.

## 2022-01-17 NOTE — ED Provider Notes (Signed)
[facility] Provider Note    Event Date/Time   First MD Initiated Contact with Patient 01/17/22 1224     (approximate)   History   Abdominal Pain and Vaginal Itching   HPI  Laura [place] is a 31 y.o. female was very nervous in fact so nervous that she is having some trouble speaking.  She reports that when she has her menstrual period she has rectal pain.  She had rectal pain today it was severe is worse than she has ever had it before.  She also has an vaginal bleeding and some itching in her vagina.  She also had some abdominal pain that seemed in triage at least worse in the epigastric area.  It is somewhat diffuse now improved and the rectal pain is better as well.  Patient does not have a fever.  She is very tachycardic although less so now.      Physical Exam   Triage Vital Signs: ED Triage Vitals  Enc Vitals Group     BP 01/17/22 1038 (!) 151/110     Pulse Rate 01/17/22 1038 (!) 125     Resp 01/17/22 1038 (!) 28     Temp 01/17/22 1038 98.2 F (36.8 C)     Temp Source 01/17/22 1038 Oral     SpO2 01/17/22 1038 100 %     Weight --      Height 01/17/22 1040 5\' 6"  (1.676 m)     Head Circumference --      Peak Flow --      Pain Score 01/17/22 1039 10     Pain Loc --      Pain Edu? --      Excl. in [place]? --     Most recent vital signs: Vitals:   01/17/22 1038 01/17/22 1249  BP: (!) 151/110 (!) 140/99  Pulse: (!) 125 100  Resp: (!) 28 20  Temp: 98.2 F (36.8 C)   SpO2: 100% 100%    General: Awake, no distress.  But very anxious CV:  Good peripheral perfusion.  Heart regular rate and rhythm no audible murmurs not is tacky now Resp:  Normal effort.  Lungs are clear Abd:  No distention.  Mild diffuse tenderness no CVA tenderness GYN: GYN exam done with the tech in the room the entire time.  Patient was very nervous but tolerated very well.  Normal perineum.  I was able to insert the speculum about 2 inches before the patient became  very uncomfortable she had not had 1 of these exams before.  I did see a good bit of vaginal wall I did not see any erythema or lesions.  The patient was on her.  There was a fair amount of dark blood present.  Unfortunately the wet prep when it was done had some of the fluid spill out in the lab and has now called and said there is not enough fluid to do the wet prep.  Because the patient had a good deal of difficulty with this exam I will not repeat the wet prep.  We will get the [facility] and chlamydia back.  I do not expect these to be positive.  I did not see any evidence of any discharge whatsoever.  There is no sign of yeast or anything else on the walls of the vagina. Rectal: No masses were palpated.  There was a lot of blood posteriorly in my finger had blood on it when I retrieved it.  Patient denies ever having any blood in the stool with blood on my finger is likely from the tissue around the rectum.  ED Results / Procedures / Treatments   Labs (all labs ordered are listed, but only abnormal results are displayed) Labs Reviewed  WET PREP, GENITAL - Abnormal; Notable for the following components:      Result Value   Clue Cells Wet Prep HPF POC PRESENT (*)    All other components within normal limits  COMPREHENSIVE METABOLIC PANEL - Abnormal; Notable for the following components:   CO2 21 (*)    Glucose, Bld 126 (*)    All other components within normal limits  URINALYSIS, ROUTINE W REFLEX MICROSCOPIC - Abnormal; Notable for the following components:   Color, Urine RED (*)    APPearance HAZY (*)    Glucose, UA   (*)    Value: TEST NOT REPORTED DUE TO COLOR INTERFERENCE OF URINE PIGMENT   Hgb urine dipstick   (*)    Value: TEST NOT REPORTED DUE TO COLOR INTERFERENCE OF URINE PIGMENT   Bilirubin Urine   (*)    Value: TEST NOT REPORTED DUE TO COLOR INTERFERENCE OF URINE PIGMENT   Ketones, ur   (*)    Value: TEST NOT REPORTED DUE TO COLOR INTERFERENCE OF URINE PIGMENT   Protein, ur   (*)     Value: TEST NOT REPORTED DUE TO COLOR INTERFERENCE OF URINE PIGMENT   Nitrite   (*)    Value: TEST NOT REPORTED DUE TO COLOR INTERFERENCE OF URINE PIGMENT   Leukocytes,Ua   (*)    Value: TEST NOT REPORTED DUE TO COLOR INTERFERENCE OF URINE PIGMENT   All other components within normal limits  CHLAMYDIA/NGC RT PCR ([facility] ONLY)            LIPASE, BLOOD  CBC  URINALYSIS, MICROSCOPIC (REFLEX)  CBC WITH DIFFERENTIAL/PLATELET  POC URINE PREG, ED     EKG     RADIOLOGY CT read by radiology reviewed and interpreted by me shows no acute disease   PROCEDURES:  Critical Care performed:   Procedures   MEDICATIONS ORDERED IN ED: Medications  iohexol (OMNIPAQUE) 300 MG/ML solution 100 mL (100 mLs Intravenous Contrast Given 01/17/22 1347)     IMPRESSION / MDM / ASSESSMENT AND PLAN / ED COURSE  I reviewed the triage vital signs and the nursing notes. I discussed with the patient the possibility she may have these bad pains because of endometriosis and request that she followed up with OB/GYN.  I have provided Dr. Francena Hanly contact information.  Because of the fact that she has a severe rectal pain every 3-4 menses I also recommended to her to follow-up with GI.  She seemed less interested in this although hopefully she will do this as well.  Differential diagnosis includes, but is not limited to, STD is a possibility.  Just having itching from the blood being present could be a possibility as well.  There is no sign of any yeast infection or trichomonas although I suppose a mild infection could be present.  Patient should follow-up with GYN after her menses is over for further testing if she still having the itchiness.  I have advised her to follow-up with both GYN and GI since she had the rectal pain.  Perhaps she has a component of endometriosis involving the rectal wall from the outside.  I did not feel any masses in noted.  Patient's wet prep did return showing some clue cells.  I will  give her some Flagyl for this.  Not sure what the phone call about the wet prep was earlier.  Patient's presentation is most consistent with severe exacerbation of chronic illness.     FINAL CLINICAL IMPRESSION(S) / ED DIAGNOSES   Final diagnoses:  Dysmenorrhea  Bacterial vaginosis     Rx / DC Orders   ED Discharge Orders          Ordered    Nursing communication       Comments: We will discharge the patient after the wet prep returns.   01/17/22 1506    metroNIDAZOLE (FLAGYL) 500 MG tablet  2 times daily        01/17/22 1534             Note:  This document was prepared using Dragon voice recognition software and may include unintentional dictation errors.   Arnaldo Natal, MD 01/17/22 1534
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
## 2022-06-14 ENCOUNTER — Other Ambulatory Visit: Payer: Self-pay

## 2022-06-14 ENCOUNTER — Emergency Department
Admission: EM | Admit: 2022-06-14 | Discharge: 2022-06-14 | Disposition: A | Discharge status: Home / Self Care | Payer: Self-pay | Attending: Emergency Medicine | Admitting: Emergency Medicine

## 2022-06-14 DIAGNOSIS — N390 Urinary tract infection, site not specified: Secondary | ICD-10-CM

## 2022-06-14 DIAGNOSIS — Z1152 Encounter for screening for COVID-19: Secondary | ICD-10-CM | POA: Insufficient documentation

## 2022-06-14 DIAGNOSIS — K529 Noninfective gastroenteritis and colitis, unspecified: Secondary | ICD-10-CM

## 2022-06-14 LAB — COMPREHENSIVE METABOLIC PANEL
ALT: 45 U/L — ABNORMAL HIGH (ref 0–44)
AST: 39 U/L (ref 15–41)
Albumin: 3.9 g/dL (ref 3.5–5.0)
Alkaline Phosphatase: 48 U/L (ref 38–126)
Anion gap: 10 (ref 5–15)
BUN: 14 mg/dL (ref 6–20)
CO2: 18 mmol/L — ABNORMAL LOW (ref 22–32)
Calcium: 8.6 mg/dL — ABNORMAL LOW (ref 8.9–10.3)
Chloride: 109 mmol/L (ref 98–111)
Creatinine, Ser: 0.79 mg/dL (ref 0.44–1.00)
GFR, Estimated: >60 mL/min (ref >60)
Glucose, Bld: 115 mg/dL — ABNORMAL HIGH (ref 70–99)
Potassium: 3.3 mmol/L — ABNORMAL LOW (ref 3.5–5.1)
Sodium: 137 mmol/L (ref 135–145)
Total Bilirubin: 0.3 mg/dL (ref 0.3–1.2)
Total Protein: 7.4 g/dL (ref 6.5–8.1)

## 2022-06-14 LAB — CBC
HCT: 41 % (ref 36.0–46.0)
Hemoglobin: 14 g/dL (ref 12.0–15.0)
MCH: 28.6 pg (ref 26.0–34.0)
MCHC: 34.1 g/dL (ref 30.0–36.0)
MCV: 83.8 fL (ref 80.0–100.0)
Platelets: 378 10*3/uL (ref 150–400)
RBC: 4.89 MIL/uL (ref 3.87–5.11)
RDW: 12.7 % (ref 11.5–15.5)
WBC: 7.7 10*3/uL (ref 4.0–10.5)
nRBC: 0 % (ref 0.0–0.2)

## 2022-06-14 LAB — PREGNANCY, URINE: Preg Test, Ur: NEGATIVE

## 2022-06-14 LAB — URINALYSIS, ROUTINE W REFLEX MICROSCOPIC
Bilirubin Urine: NEGATIVE
Glucose, UA: NEGATIVE mg/dL
Hgb urine dipstick: NEGATIVE
Ketones, ur: NEGATIVE mg/dL
Nitrite: NEGATIVE
Protein, ur: 30 mg/dL — AB
Specific Gravity, Urine: 1.03 (ref 1.005–1.030)
WBC, UA: >50 WBC/hpf — ABNORMAL HIGH (ref 0–5)
pH: 6 (ref 5.0–8.0)

## 2022-06-14 LAB — RESP PANEL BY RT-PCR (RSV, FLU A&B, COVID)  RVPGX2
Influenza A by PCR: NEGATIVE
Influenza B by PCR: NEGATIVE
Resp Syncytial Virus by PCR: NEGATIVE
SARS Coronavirus 2 by RT PCR: NEGATIVE

## 2022-06-14 LAB — LIPASE, BLOOD: Lipase: 32 U/L (ref 11–51)

## 2022-06-14 MED ORDER — NITROFURANTOIN MONOHYD MACRO 100 MG PO CAPS: 100.0000 mg | ORAL_CAPSULE | Freq: Two times a day (BID) | ORAL | 0 refills | Status: AC

## 2022-06-14 MED ORDER — SODIUM CHLORIDE 0.9 % IV BOLUS
1000.0000 mL | Freq: Once | INTRAVENOUS | Status: AC
  Administered 2022-06-14: 1000 mL via INTRAVENOUS

## 2022-06-14 MED ORDER — ONDANSETRON HCL 4 MG PO TABS: 4.0000 mg | ORAL_TABLET | Freq: Three times a day (TID) | ORAL | 0 refills | Status: AC | PRN

## 2022-06-14 MED ORDER — ONDANSETRON HCL 4 MG/2ML IJ SOLN
4.0000 mg | Freq: Once | INTRAMUSCULAR | Status: AC
  Administered 2022-06-14: 4 mg via INTRAVENOUS
  Filled 2022-06-14: qty 2

## 2022-06-14 NOTE — Discharge Instructions (Signed)
Please seek medical attention for any high fevers, chest pain, shortness of breath, change in behavior, persistent vomiting, bloody stool or any other new or concerning symptoms.  

## 2022-06-14 NOTE — ED Triage Notes (Signed)
Pt complaining of having diarrhea that started Thursday. Stomach hurts all over, nauseated has not vomited. Said she feels shaky now and weak

## 2022-06-14 NOTE — ED Provider Notes (Signed)
[facility] Provider Note    Event Date/Time   First MD Initiated Contact with Patient 06/14/22 [phone]     (approximate)   History   Diarrhea   HPI  Laura Sullivan is a 32 y.o. female  who presents to the emergency department today because of concern for abdominal pain and diarrhea. The patient's symptoms started 4 days ago. She says it did start after having some waffle house, although she has had waffle house many times without any issues. Immediately after eating or drinking anything she will need to use the restroom. It is accompanied by generalized abdominal discomfort. The patient denies any fevers. She denies history of abdominal issues. No known sick contacts.        Physical Exam   Triage Vital Signs: ED Triage Vitals  Enc Vitals Group     BP 06/14/22 0653 (!) 129/101     Pulse Rate 06/14/22 0653 (!) 118     Resp 06/14/22 0653 20     Temp 06/14/22 0653 99.6 F (37.6 C)     Temp Source 06/14/22 0653 Oral     SpO2 06/14/22 0653 98 %     Weight 06/14/22 0654 243 lb (110.2 kg)     Height 06/14/22 0654 5\' 6"  (1.676 m)     Head Circumference --      Peak Flow --      Pain Score 06/14/22 0659 6     Pain Loc --      Pain Edu? --      Excl. in [place]? --     Most recent vital signs: Vitals:   06/14/22 0653 06/14/22 0658  BP: (!) 129/101   Pulse: (!) 118   Resp: 20   Temp: 99.6 F (37.6 C)   SpO2: 98% 98%    General: Awake, alert, oriented. CV:  Good peripheral perfusion. Regular rate and rhythm. Resp:  Normal effort. Lungs clear. Abd:  No distention. Non tender.   ED Results / Procedures / Treatments   Labs (all labs ordered are listed, but only abnormal results are displayed) Labs Reviewed  COMPREHENSIVE METABOLIC PANEL - Abnormal; Notable for the following components:      Result Value   Potassium 3.3 (*)    CO2 18 (*)    Glucose, Bld 115 (*)    Calcium 8.6 (*)    ALT 45 (*)    All other components within normal limits   URINALYSIS, ROUTINE W REFLEX MICROSCOPIC - Abnormal; Notable for the following components:   Color, Urine YELLOW (*)    APPearance CLOUDY (*)    Protein, ur 30 (*)    Leukocytes,Ua LARGE (*)    WBC, UA >50 (*)    Bacteria, UA FEW (*)    All other components within normal limits  RESP PANEL BY RT-PCR (RSV, FLU A&B, COVID)  RVPGX2  LIPASE, BLOOD  CBC  PREGNANCY, URINE  POC URINE PREG, ED     EKG  None   RADIOLOGY None   PROCEDURES:  Critical Care performed: No  Procedures   MEDICATIONS ORDERED IN ED: Medications - No data to display   IMPRESSION / MDM / [place] / ED COURSE  I reviewed the triage vital signs and the nursing notes.                              Differential diagnosis includes, but is not limited to, gastroenteritis,  appendicitis, colitis, pancreatitis.  Patient's presentation is most consistent with acute presentation with potential threat to life or bodily function.  Patient presented to the emergency department today because of concerns for abdominal pain and diarrhea.  On exam patient without any significant abdominal tenderness.  Blood work without any concerning leukocytosis.  Patient did feel better after IV fluids and medication.  Urine was concerning for possible urinary tract infection.  Discussed this with the patient.  Will discharge with antibiotics and antinausea medication.     FINAL CLINICAL IMPRESSION(S) / ED DIAGNOSES   Final diagnoses:  Gastroenteritis  Lower urinary tract infection    Note:  This document was prepared using Dragon voice recognition software and may include unintentional dictation errors.    Nance Pear, MD 06/14/22 1256
